# Patient Record
Sex: Female | Born: 2000 | Race: White | Hispanic: No | Marital: Single | Smoking: Never smoker
Health system: Southern US, Community
[De-identification: ages and names within clinical notes are randomized; demographics above are authoritative.]

---

## 2019-02-28 ENCOUNTER — Ambulatory Visit (HOSPITAL_COMMUNITY)
Admission: EM | Admit: 2019-02-28 | Discharge: 2019-02-28 | Disposition: A | Discharge status: Home / Self Care | Payer: Self-pay | Attending: Family Medicine | Admitting: Family Medicine

## 2019-02-28 ENCOUNTER — Ambulatory Visit (HOSPITAL_COMMUNITY): Payer: Self-pay

## 2019-02-28 ENCOUNTER — Ambulatory Visit (INDEPENDENT_AMBULATORY_CARE_PROVIDER_SITE_OTHER): Payer: Self-pay

## 2019-02-28 ENCOUNTER — Other Ambulatory Visit: Payer: Self-pay

## 2019-02-28 ENCOUNTER — Encounter (HOSPITAL_COMMUNITY): Payer: Self-pay

## 2019-02-28 DIAGNOSIS — W109XXA Fall (on) (from) unspecified stairs and steps, initial encounter: Secondary | ICD-10-CM

## 2019-02-28 DIAGNOSIS — M25552 Pain in left hip: Secondary | ICD-10-CM

## 2019-02-28 DIAGNOSIS — M545 Low back pain, unspecified: Secondary | ICD-10-CM

## 2019-02-28 DIAGNOSIS — W19XXXA Unspecified fall, initial encounter: Secondary | ICD-10-CM

## 2019-02-28 MED ORDER — IBUPROFEN 800 MG PO TABS: 800.0000 mg | ORAL_TABLET | Freq: Three times a day (TID) | ORAL | 0 refills | Status: AC

## 2019-02-28 MED ORDER — CYCLOBENZAPRINE HCL 5 MG PO TABS: 5.0000 mg | ORAL_TABLET | Freq: Two times a day (BID) | ORAL | 0 refills | Status: AC | PRN

## 2019-02-28 NOTE — ED Provider Notes (Signed)
[facility]    CSN: [id number] Arrival date & time: 02/28/19  1349      History   Chief Complaint Chief Complaint  Patient presents with  . Appointment    2 pm  . Fall  . Hip Pain    HPI Lauren Dixon is a 18 y.o. female no significant past medical history presenting today for evaluation of back and hip pain after fall.  Patient slipped on the stairs earlier today landed on her back and slid downward.  Since she has had pain to her lower back and left hip.  She is concerned as she has had previous 2 dislocations of her left hip.  She attempted to go to her chiropractor who recommended x-rays.  She has had a lot of pain with weightbearing on her left leg.  Denies any radiation of pain or numbness or tingling.  Denies saddle anesthesia.  Denies issues with controlling urination or bowel movements since accident.  She is taking ibuprofen 800 for pain.  HPI  History reviewed. No pertinent past medical history.  There are no problems to display for this patient.   History reviewed. No pertinent surgical history.  OB History   No obstetric history on file.      Home Medications    Prior to Admission medications   Medication Sig Start Date End Date Taking? Authorizing Provider  cyclobenzaprine (FLEXERIL) 5 MG tablet Take 1-2 tablets (5-10 mg total) by mouth 2 (two) times daily as needed for muscle spasms. 02/28/19   Silvie Obremski C, PA-C  ibuprofen (ADVIL) 800 MG tablet Take 1 tablet (800 mg total) by mouth 3 (three) times daily. 02/28/19   Devin Foskey, Junius Creamer, PA-C    Family History Family History  Problem Relation Age of Onset  . Healthy Mother   . Healthy Father     Social History Social History   Tobacco Use  . Smoking status: Never Smoker  . Smokeless tobacco: Never Used  Substance Use Topics  . Alcohol use: Not Currently  . Drug use: Never     Allergies   Patient has no known allergies.   Review of Systems Review of Systems    Constitutional: Negative for fatigue and fever.  Eyes: Negative for visual disturbance.  Respiratory: Negative for shortness of breath.   Cardiovascular: Negative for chest pain.  Gastrointestinal: Negative for abdominal pain, nausea and vomiting.  Genitourinary: Negative for decreased urine volume and difficulty urinating.  Musculoskeletal: Positive for arthralgias, back pain, gait problem and myalgias. Negative for joint swelling.  Skin: Negative for color change, rash and wound.  Neurological: Negative for dizziness, weakness, light-headedness and headaches.     Physical Exam Triage Vital Signs ED Triage Vitals  Enc Vitals Group     BP 02/28/19 1414 117/79     Pulse Rate 02/28/19 1414 77     Resp 02/28/19 1414 16     Temp 02/28/19 1414 98.5 F (36.9 C)     Temp Source 02/28/19 1414 Oral     SpO2 02/28/19 1414 100 %     Weight --      Height --      Head Circumference --      Peak Flow --      Pain Score 02/28/19 1412 9     Pain Loc --      Pain Edu? --      Excl. in GC? --    No data found.  Updated Vital Signs BP 117/79 (BP  Location: Left Arm)   Pulse 77   Temp 98.5 F (36.9 C) (Oral)   Resp 16   LMP 02/01/2019   SpO2 100%   Visual Acuity Right Eye Distance:   Left Eye Distance:   Bilateral Distance:    Right Eye Near:   Left Eye Near:    Bilateral Near:     Physical Exam Vitals and nursing note reviewed.  Constitutional:      Appearance: She is well-developed.     Comments: Sitting in wheelchair, appears uncomfortable but no acute distress  HENT:     Head: Normocephalic and atraumatic.     Nose: Nose normal.  Eyes:     Conjunctiva/sclera: Conjunctivae normal.  Cardiovascular:     Rate and Rhythm: Normal rate.  Pulmonary:     Effort: Pulmonary effort is normal. No respiratory distress.  Abdominal:     General: There is no distension.  Musculoskeletal:        General: Normal range of motion.     Cervical back: Neck supple.     Comments: Area  of erythema and mild swelling noted to upper lumbar area spine midline, tenderness to palpation in this area, nontender to palpation of cervical and thoracic spine midline, nontender to lower lumbar spine midline, tenderness throughout left lumbar region, nontender over bony prominences of hip, nontender throughout left anterior groin area and proximal leg  Skin:    General: Skin is warm and dry.  Neurological:     Mental Status: She is alert and oriented to person, place, and time.      UC Treatments / Results  Labs (all labs ordered are listed, but only abnormal results are displayed) Labs Reviewed - No data to display  EKG   Radiology DG Lumbar Spine Complete  Result Date: 02/28/2019 CLINICAL DATA:  Fall down the stairs EXAM: LUMBAR SPINE - COMPLETE 4+ VIEW COMPARISON:  None. FINDINGS: There appears to be a rudimentary disc between S1-S2. There is a minimal retrolisthesis of L5 on S1. There is no evidence of lumbar spine fracture. Alignment is normal. Intervertebral disc spaces are maintained. IMPRESSION: No acute fracture or malalignment. Electronically Signed   By: Jonna ClarkBindu  Avutu M.D.   On: 02/28/2019 15:25   DG Hip Unilat With Pelvis 2-3 Views Left  Result Date: 02/28/2019 CLINICAL DATA:  Lower back pain after hip pain and fall EXAM: DG HIP (WITH OR WITHOUT PELVIS) 2-3V LEFT COMPARISON:  None. FINDINGS: There is no evidence of hip fracture or dislocation. There is no evidence of arthropathy or other focal bone abnormality. IMPRESSION: Negative. Electronically Signed   By: Jonna ClarkBindu  Avutu M.D.   On: 02/28/2019 15:27    Procedures Procedures (including critical care time)  Medications Ordered in UC Medications - No data to display  Initial Impression / Assessment and Plan / UC Course  I have reviewed the triage vital signs and the nursing notes.  Pertinent labs & imaging results that were available during my care of the patient were reviewed by me and considered in my medical  decision making (see chart for details).     X-rays negative for acute fracture or dislocation.  Will treat as likely lumbar strain and contusion from impact.  Continue anti-inflammatories, Flexeril as needed at home/bedtime.  Discussed activity modification.  Patient having pain with weightbearing, will provide crutches and slowly transition to weightbearing as pain in low back/left hip improved.  Advised to follow-up if symptoms not improving or worsening.  Discussed strict return precautions. Patient  verbalized understanding and is agreeable with plan.  Final Clinical Impressions(s) / UC Diagnoses   Final diagnoses:  Hip pain, acute, left  Acute left-sided low back pain without sciatica  Fall, initial encounter     Discharge Instructions     No fracture or dislocation  Use anti-inflammatories for pain/swelling. You may take up to 800 mg Ibuprofen every 8 hours with food. You may supplement Ibuprofen with Tylenol [phone] mg every 8 hours.   You may use flexeril as needed to help with pain. This is a muscle relaxer and causes sedation- please use only at bedtime or when you will be home and not have to drive/work  Alternate ice and heat  Follow up if not improving or worsening    ED Prescriptions    Medication Sig Dispense Auth. Provider   ibuprofen (ADVIL) 800 MG tablet Take 1 tablet (800 mg total) by mouth 3 (three) times daily. 21 tablet Kiauna Zywicki C, PA-C   cyclobenzaprine (FLEXERIL) 5 MG tablet Take 1-2 tablets (5-10 mg total) by mouth 2 (two) times daily as needed for muscle spasms. 24 tablet Shamekia Tippets, [place] C, PA-C     PDMP not reviewed this encounter.   Janith Lima, [place] 02/28/19 1541

## 2019-02-28 NOTE — ED Triage Notes (Signed)
Patient presents to Urgent Care with complaints of falling feet first down the stairs earlier today. Patient reports her left lower back and hip are in a lot of pain, has dislocated her hip in the past and her chiropractor recommended she come here for an x-ray to evaluate for possible fracture No abnormalities noted on assessment, pt is not able to hold both legs out in front of her without assistance.

## 2019-02-28 NOTE — Discharge Instructions (Signed)
No fracture or dislocation  Use anti-inflammatories for pain/swelling. You may take up to 800 mg Ibuprofen every 8 hours with food. You may supplement Ibuprofen with Tylenol [phone] mg every 8 hours.   You may use flexeril as needed to help with pain. This is a muscle relaxer and causes sedation- please use only at bedtime or when you will be home and not have to drive/work  Alternate ice and heat  Follow up if not improving or worsening

## 2020-12-18 IMAGING — DX DG LUMBAR SPINE COMPLETE 4+V
5 series · 5 of 5 positions shown · non-contrast
Comparison: None.

CLINICAL DATA: Fall down the stairs

EXAM:
LUMBAR SPINE - COMPLETE 4+ VIEW

[l-spine ap]
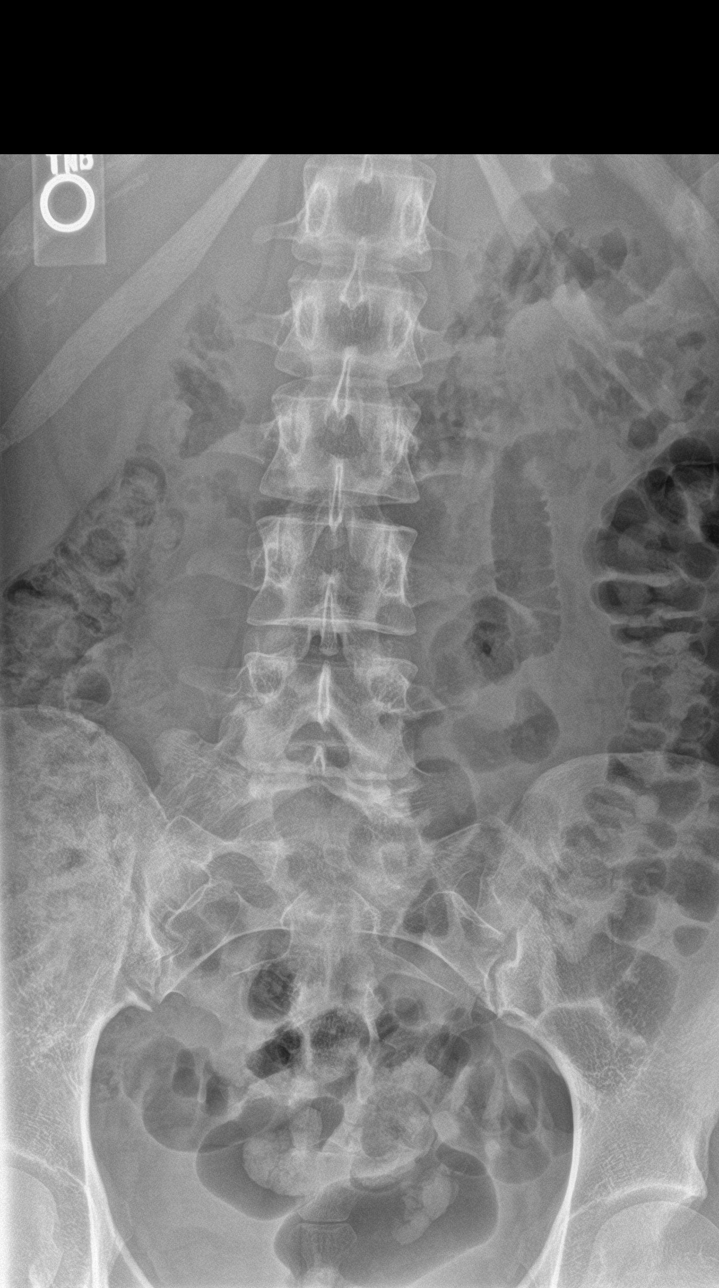

[l-spine obl (1 of 2)]
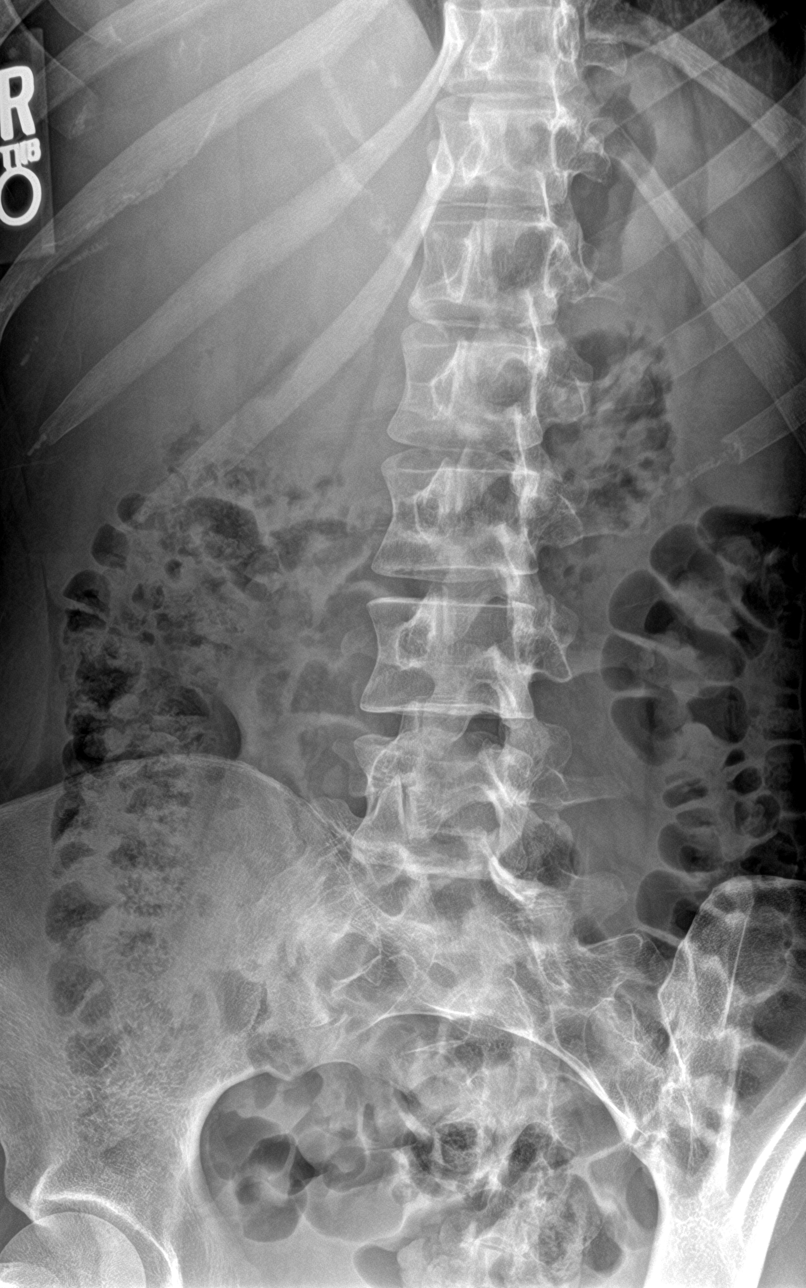

[l-spine obl (2 of 2)]
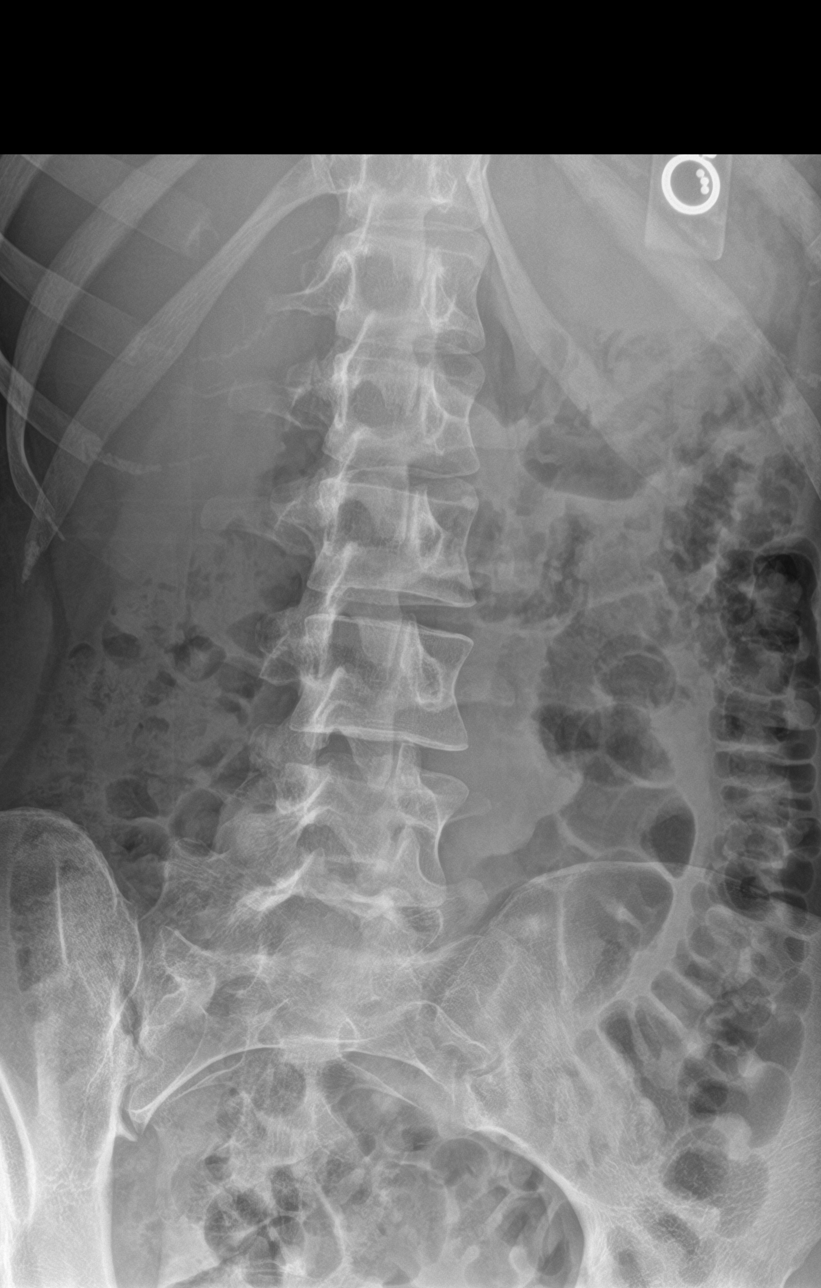

[l-spine lat]
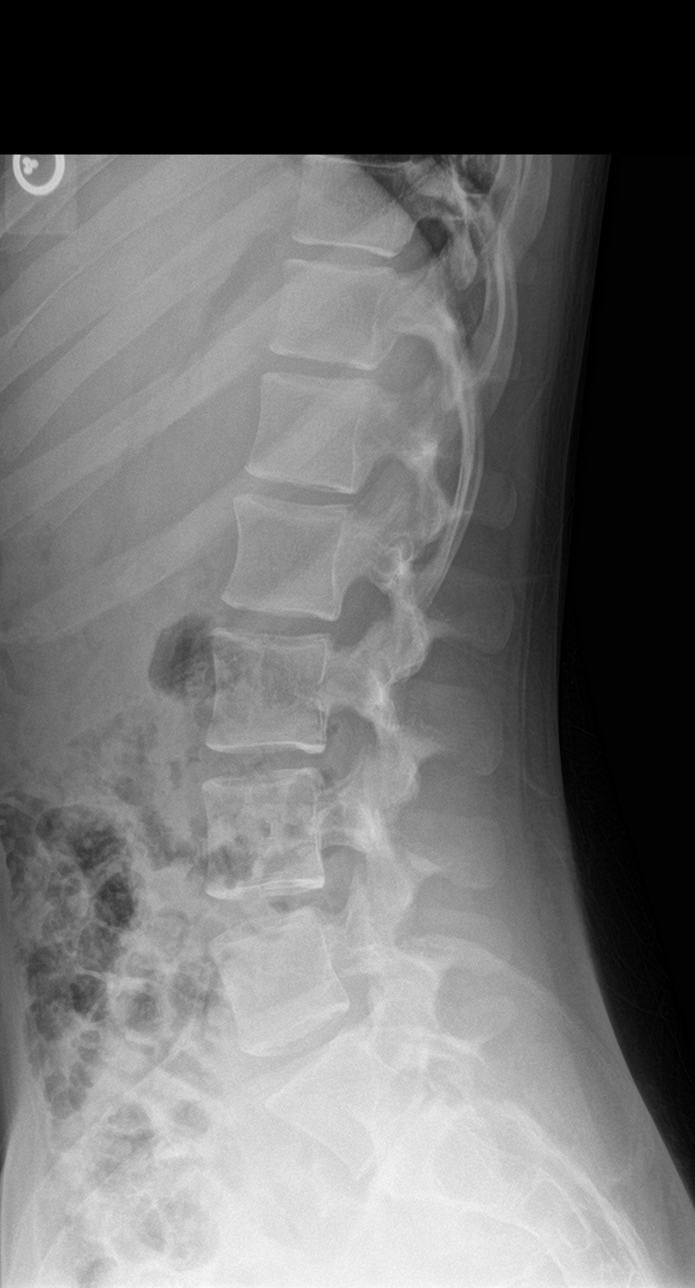

[l-spine spot]
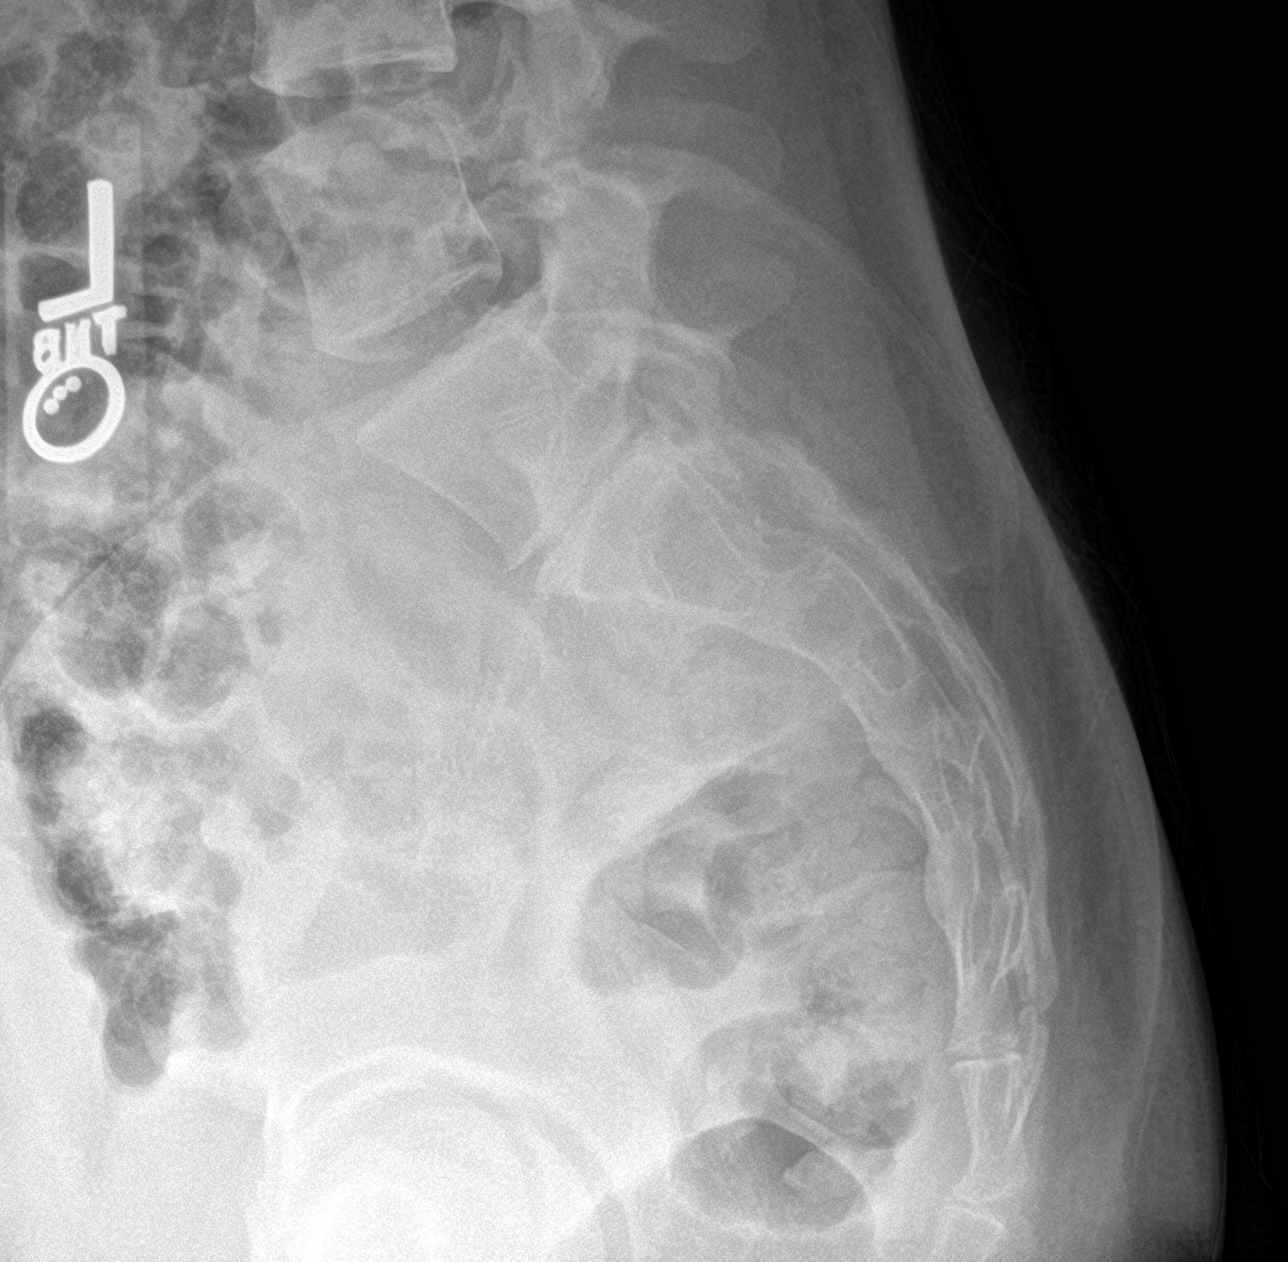

[5 of 5 positions shown; findings below may reference images not displayed]

FINDINGS: There appears to be a rudimentary disc between S1-S2. There is a
minimal retrolisthesis of L5 on S1. There is no evidence of lumbar
spine fracture. Alignment is normal. Intervertebral disc spaces are
maintained.
IMPRESSION: No acute fracture or malalignment.
